# Patient Record
Sex: Male | Born: 1970 | Marital: Single | Smoking: Never smoker
Health system: Southern US, Community
[De-identification: ages and names within clinical notes are randomized; demographics above are authoritative.]

## PROBLEM LIST (undated history)

## (undated) DIAGNOSIS — E119 Type 2 diabetes mellitus without complications: Secondary | ICD-10-CM

## (undated) DIAGNOSIS — Z789 Other specified health status: Secondary | ICD-10-CM

## (undated) HISTORY — PX: NO PAST SURGERIES: SHX2092

---

## 2011-02-20 ENCOUNTER — Ambulatory Visit: Payer: Self-pay | Admitting: Family Medicine

## 2012-05-29 ENCOUNTER — Ambulatory Visit: Payer: Self-pay | Admitting: Family Medicine

## 2012-05-29 LAB — URINALYSIS, COMPLETE
Bilirubin,UR: NEGATIVE
Glucose,UR: NEGATIVE mg/dL (ref 0–75)
Ketone: NEGATIVE
Leukocyte Esterase: NEGATIVE
Ph: 6 (ref 4.5–8.0)

## 2012-06-30 ENCOUNTER — Ambulatory Visit: Payer: Self-pay | Admitting: Physician Assistant

## 2013-12-24 ENCOUNTER — Ambulatory Visit: Payer: Self-pay | Admitting: Family Medicine

## 2014-01-25 IMAGING — CT CT STONE STUDY
1 of 2 series · 15 of 32 positions shown, 19 images · non-contrast
Comparison: None

REASON FOR EXAM: Call Report [id number]  Eval for Stone  flank pain left
to groin area hist k...
COMMENTS:

PROCEDURE:     TILELO - TILELO ABDOMEN/PELVIS WO ( STONE)  - May 29, 2012 [DATE]
RESULT:     Indication: Flank Pain
TECHNIQUE: Multiple axial images from the lung bases to the symphysis pubis
were obtained without oral and without intravenous contrast.

[Series 2: soft tissue · axial · 0.78mm/px · z∈[-915,-465]mm · 15 of 166 slices shown, 19 images]
[im 8/166  soft-tissue]
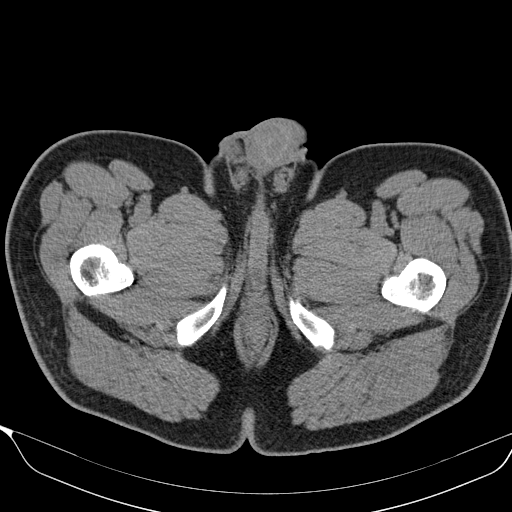
[im 8/166  bone]
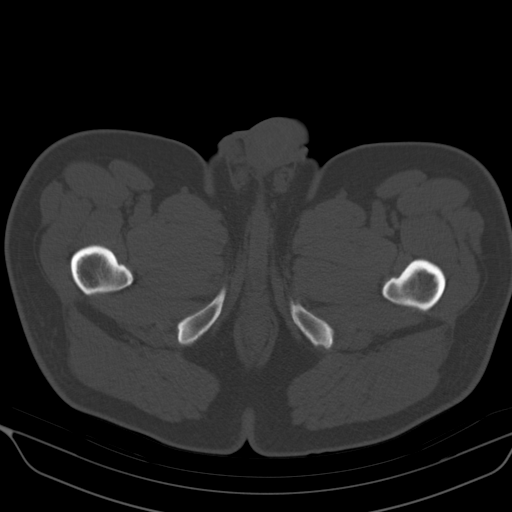
[im 22/166  soft-tissue]
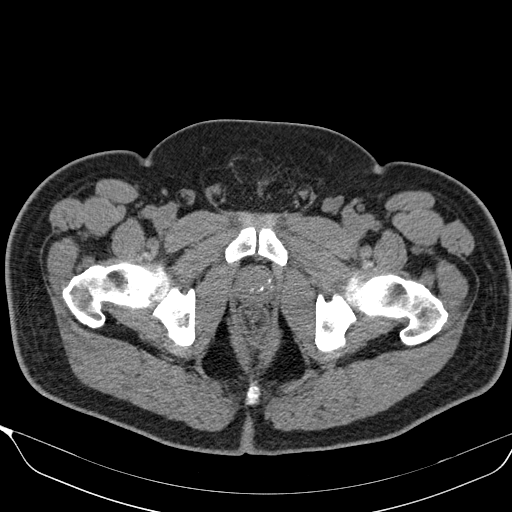
[im 36/166  soft-tissue]
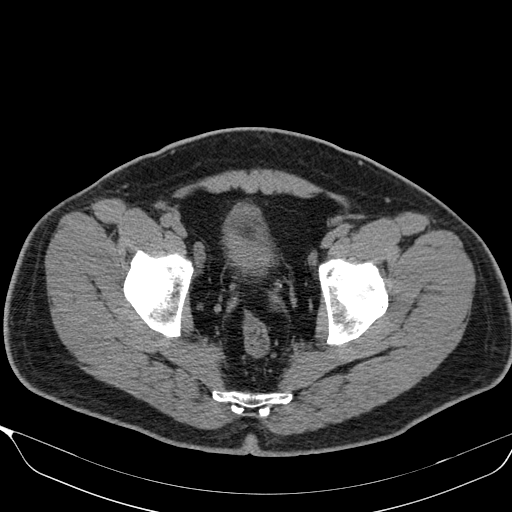
[im 44/166  soft-tissue]
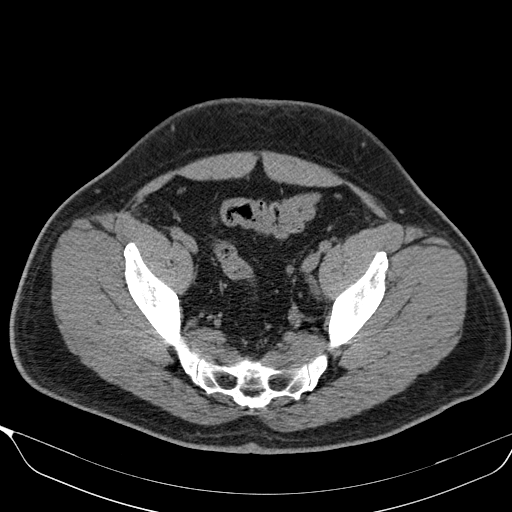
[im 58/166  soft-tissue]
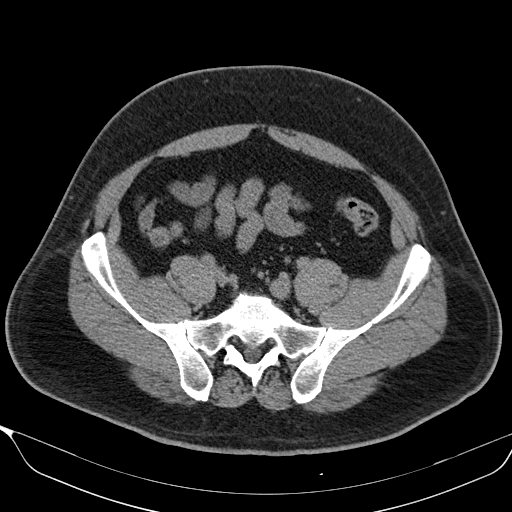
[im 72/166  soft-tissue]
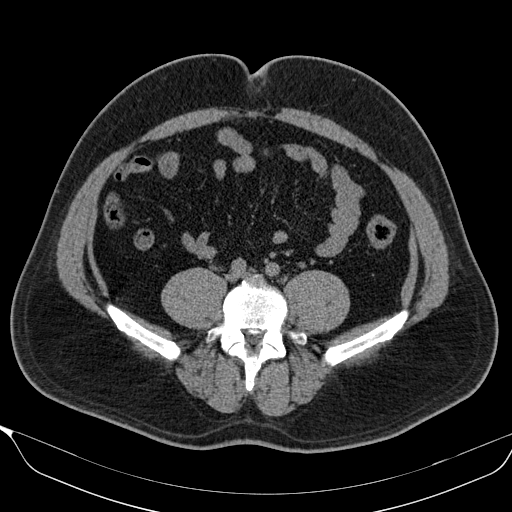
[im 87/166  soft-tissue]
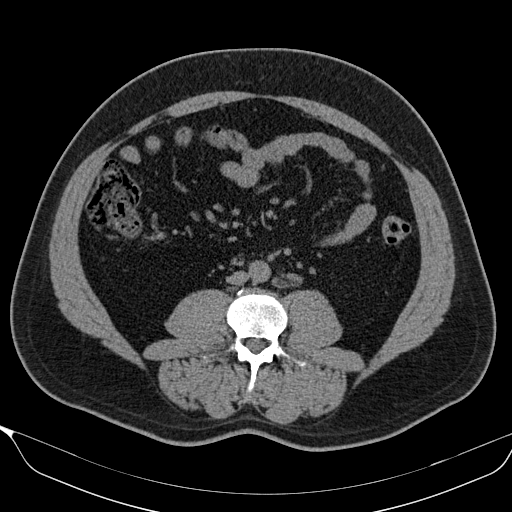
[im 94/166  soft-tissue]
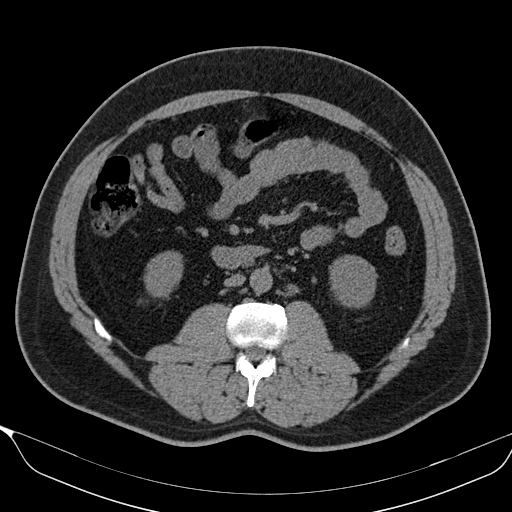
[im 108/166  soft-tissue]
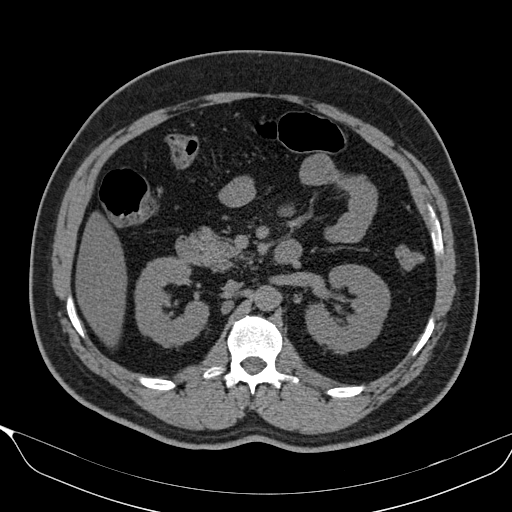
[im 108/166  bone]
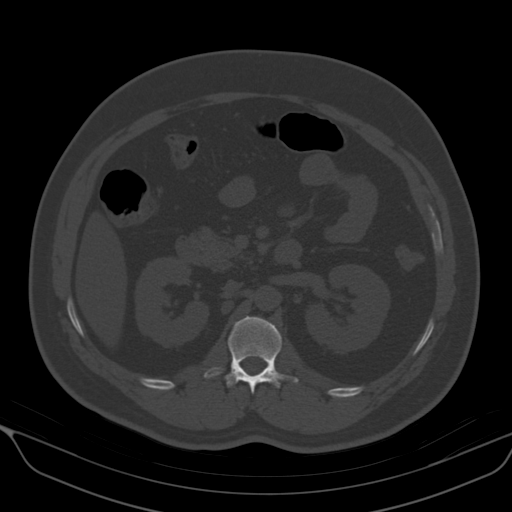
[im 122/166  soft-tissue]
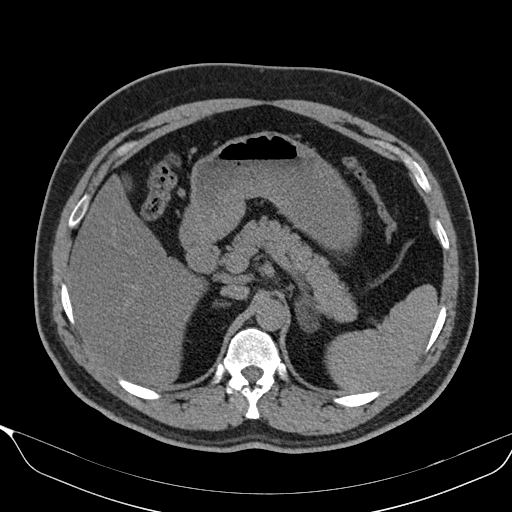
[im 130/166  soft-tissue]
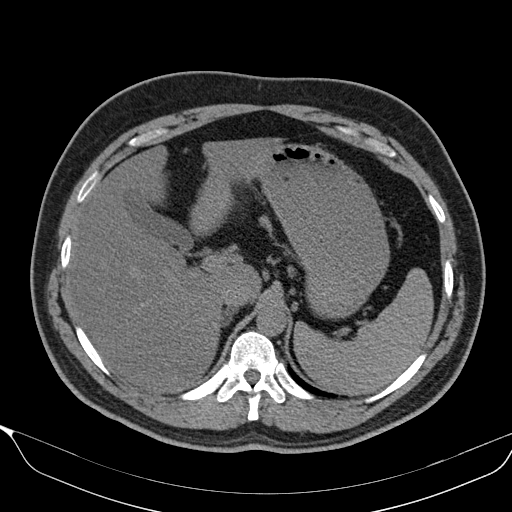
[im 137/166  lung]
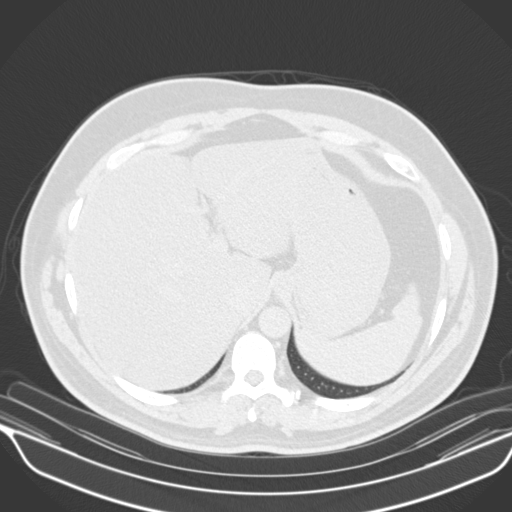
[im 144/166  soft-tissue]
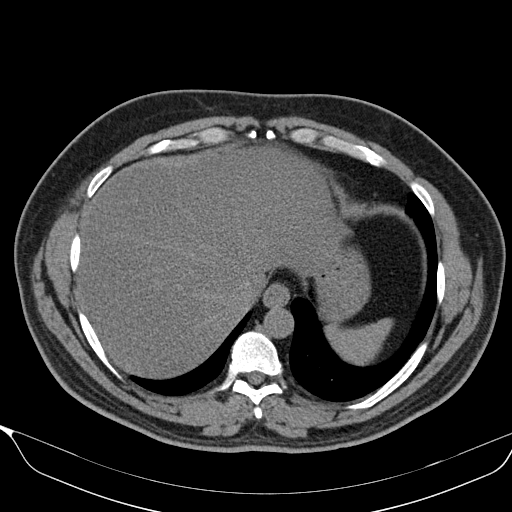
[im 144/166  lung]
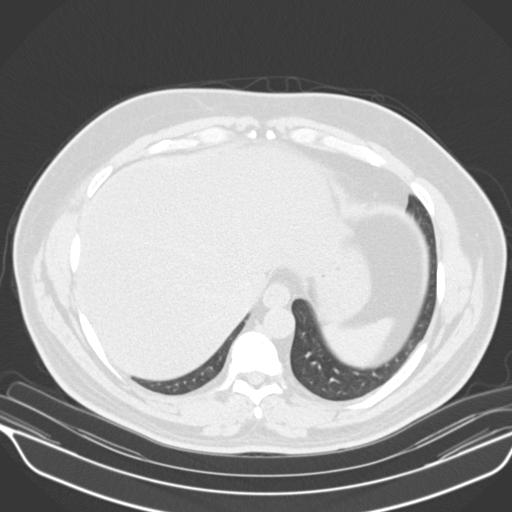
[im 151/166  lung]
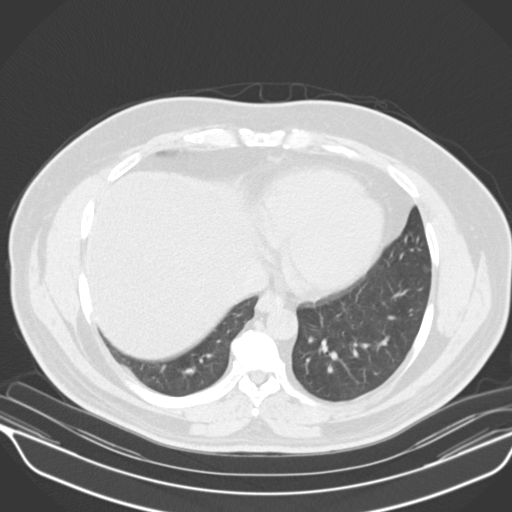
[im 158/166  soft-tissue]
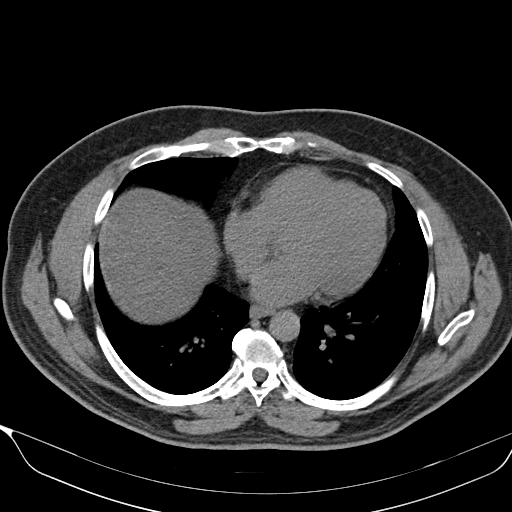
[im 158/166  lung]
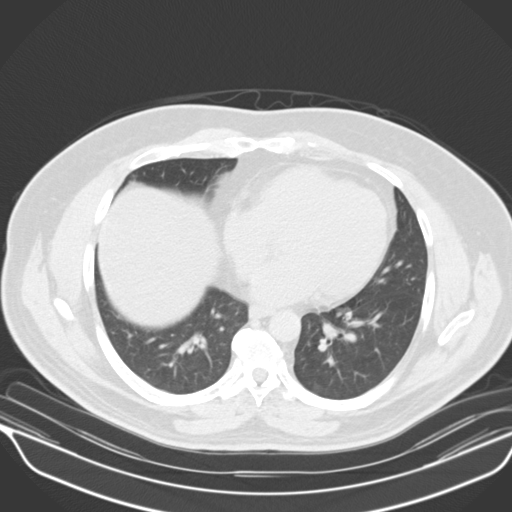

[15 of 32 positions shown; findings below may reference images not displayed]

FINDINGS: The lung bases are clear. There is no pleural or pericardial effusions.

There is a 2mm distal left ureteral calculus resulting in minimal
hydronephrosis. No perinephric stranding is seen. The kidneys are symmetric
in size without evidence for exophytic mass. The bladder is unremarkable.

The liver is diffusely low in attenuation as can be seen with hepatic
steatosis. The gallbladder is unremarkable. The spleen demonstrates no focal
abnormality. The adrenal glands and pancreas are normal.

The unopacified stomach, duodenum, small intestine, and large intestine are
unremarkable, but evaluation is limited by lack of oral contrast.  There is
no pneumoperitoneum, pneumatosis, or portal venous gas. There is no
abdominal or pelvic free fluid. There is no lymphadenopathy.

The abdominal aorta is normal in caliber .

The osseous structures are unremarkable.
IMPRESSION: 1. There is a 2mm distal left ureteral calculus resulting in minimal
hydronephrosis.

2. Hepatic steatosis.

[REDACTED]

## 2015-01-11 ENCOUNTER — Other Ambulatory Visit: Payer: Self-pay

## 2015-01-11 ENCOUNTER — Emergency Department
Admission: EM | Admit: 2015-01-11 | Discharge: 2015-01-11 | Discharge status: Against Medical Advice | Payer: BLUE CROSS/BLUE SHIELD | Attending: Emergency Medicine | Admitting: Emergency Medicine

## 2015-01-11 ENCOUNTER — Emergency Department: Payer: BLUE CROSS/BLUE SHIELD

## 2015-01-11 DIAGNOSIS — R000 Tachycardia, unspecified: Secondary | ICD-10-CM | POA: Diagnosis present

## 2015-01-11 DIAGNOSIS — R0602 Shortness of breath: Secondary | ICD-10-CM | POA: Insufficient documentation

## 2015-01-11 HISTORY — DX: Other specified health status: Z78.9

## 2015-01-11 LAB — CBC
HEMATOCRIT: 46.3 % (ref 40.0–52.0)
Hemoglobin: 16.3 g/dL (ref 13.0–18.0)
MCH: 33 pg (ref 26.0–34.0)
MCHC: 35.1 g/dL (ref 32.0–36.0)
MCV: 94 fL (ref 80.0–100.0)
PLATELETS: 221 10*3/uL (ref 150–440)
RBC: 4.92 MIL/uL (ref 4.40–5.90)
RDW: 12.6 % (ref 11.5–14.5)
WBC: 9.8 10*3/uL (ref 3.8–10.6)

## 2015-01-11 LAB — BASIC METABOLIC PANEL
Anion gap: 10 (ref 5–15)
BUN: 19 mg/dL (ref 6–20)
CALCIUM: 9.4 mg/dL (ref 8.9–10.3)
CO2: 22 mmol/L (ref 22–32)
Chloride: 105 mmol/L (ref 101–111)
Creatinine, Ser: 1.32 mg/dL — ABNORMAL HIGH (ref 0.61–1.24)
GFR calc Af Amer: >60 mL/min (ref >60)
GFR calc non Af Amer: >60 mL/min (ref >60)
GLUCOSE: 154 mg/dL — AB (ref 65–99)
Potassium: 4.4 mmol/L (ref 3.5–5.1)
Sodium: 137 mmol/L (ref 135–145)

## 2015-01-11 LAB — TROPONIN I: Troponin I: <0.03 ng/mL (ref <0.031)

## 2015-01-11 NOTE — ED Notes (Signed)
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

## 2015-08-22 IMAGING — US US GALLBLADDER-BILIARY (RUQ)
1 series · 14 of 25 positions shown · non-contrast
Comparison: None.

CLINICAL DATA: Elevated LFTs

EXAM:
US ABDOMEN LIMITED - RIGHT UPPER QUADRANT

[Series 1: us gallbladder-biliary (ruq) · 0.28mm/px · 14 of 47 slices shown]
[im 1/47]
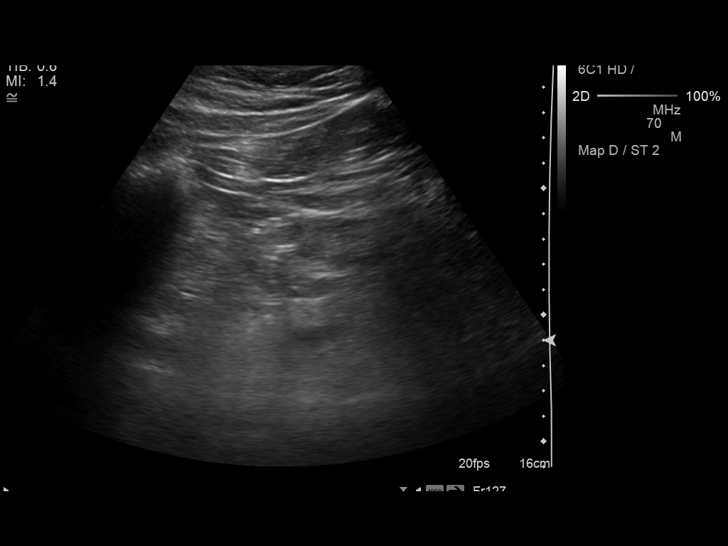
[im 4/47]
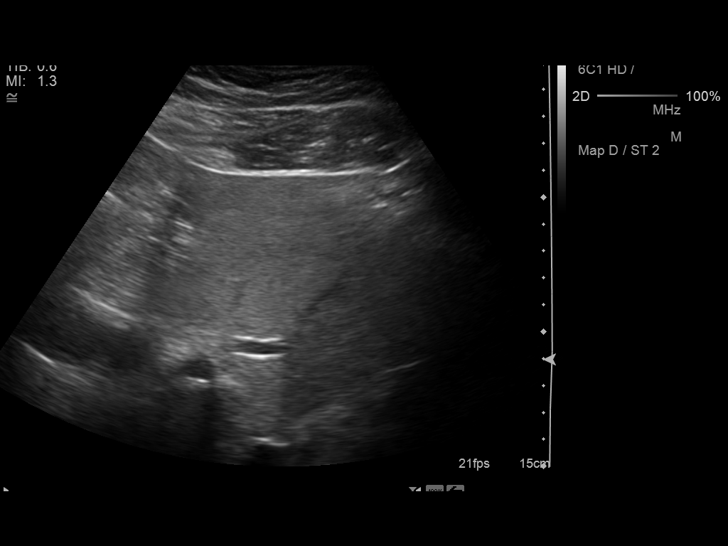
[im 8/47]
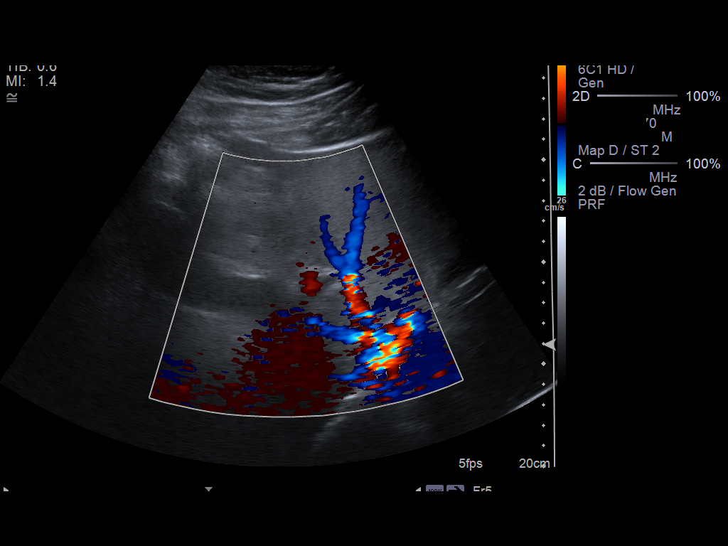
[im 12/47]
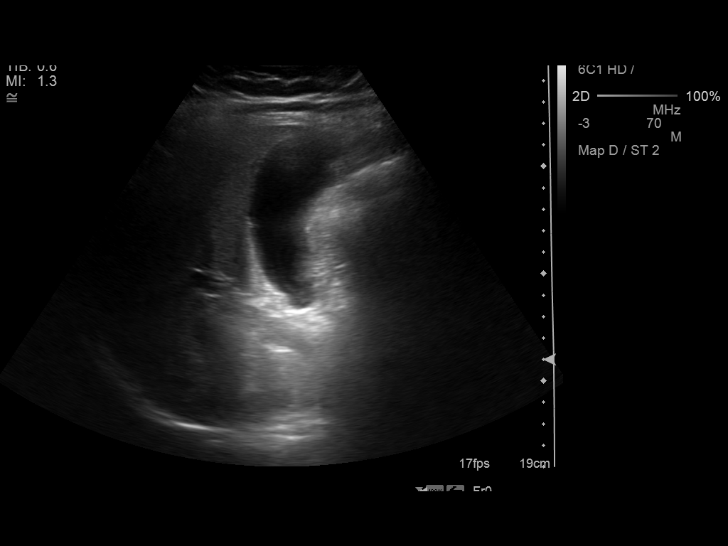
[im 16/47]
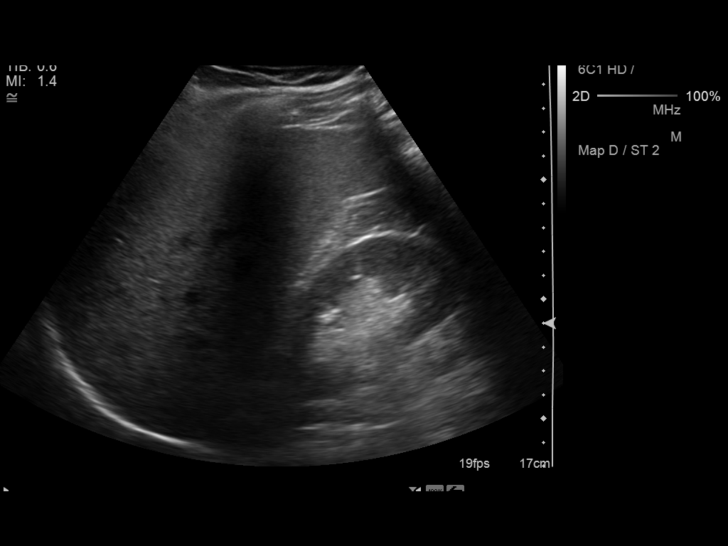
[im 18/47]
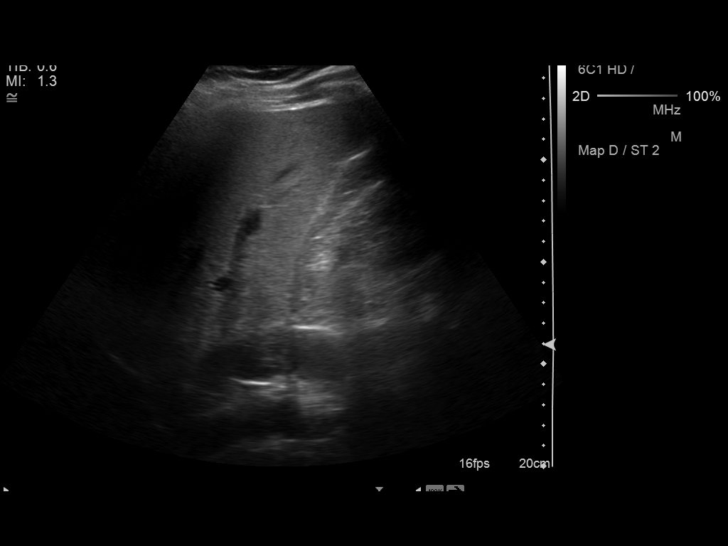
[im 22/47]
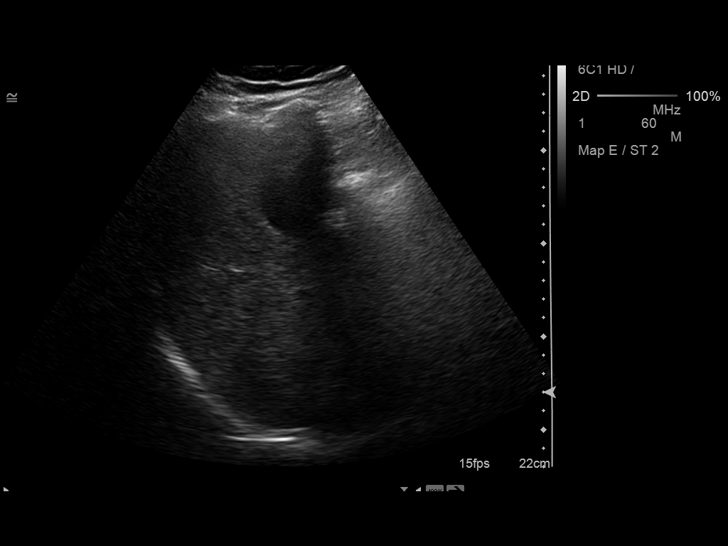
[im 25/47]
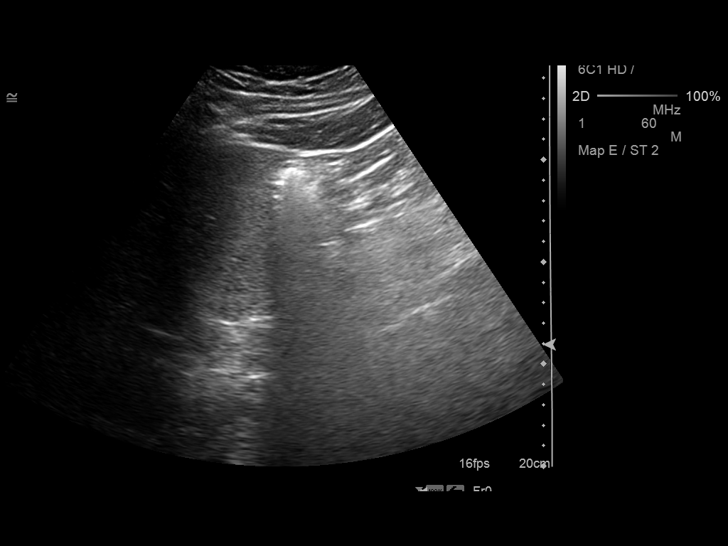
[im 29/47]
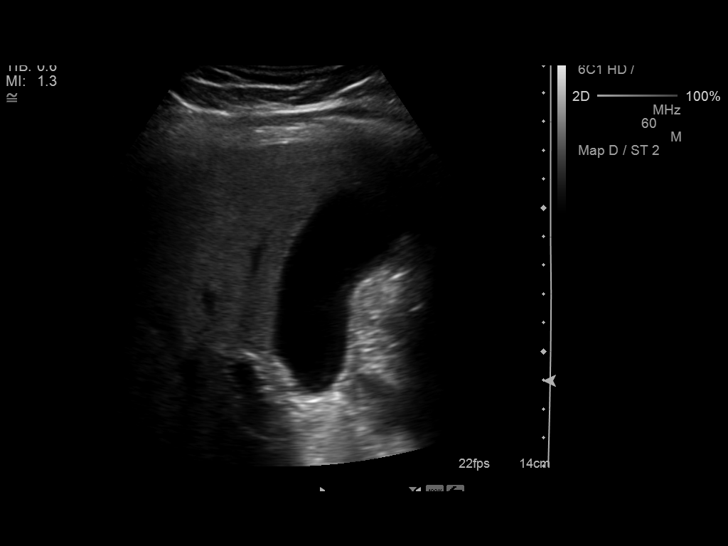
[im 31/47]
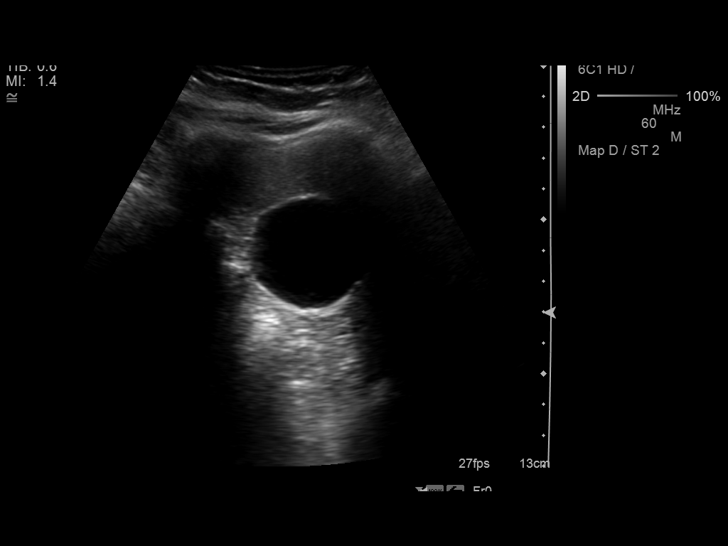
[im 35/47]
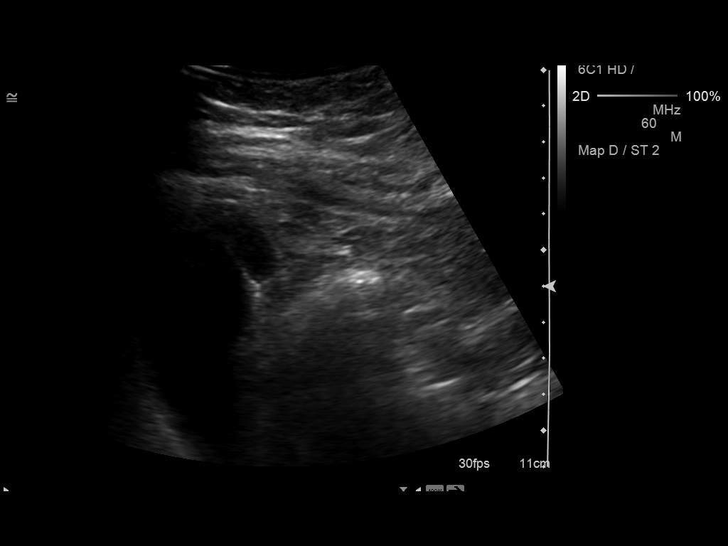
[im 39/47]
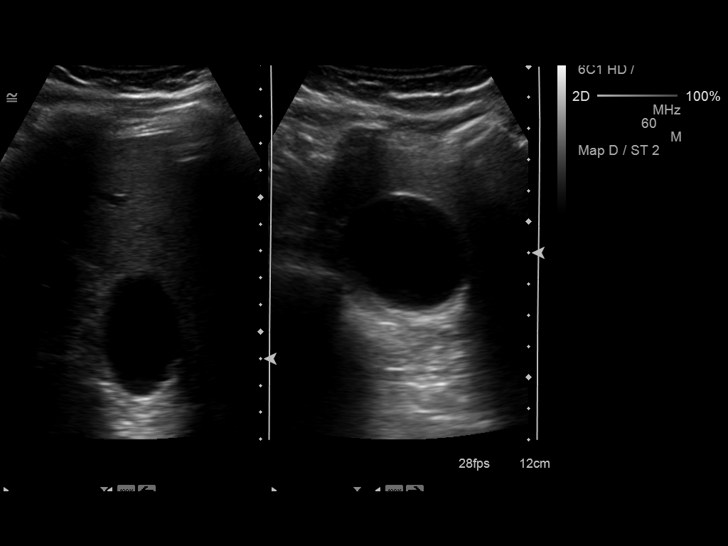
[im 43/47]
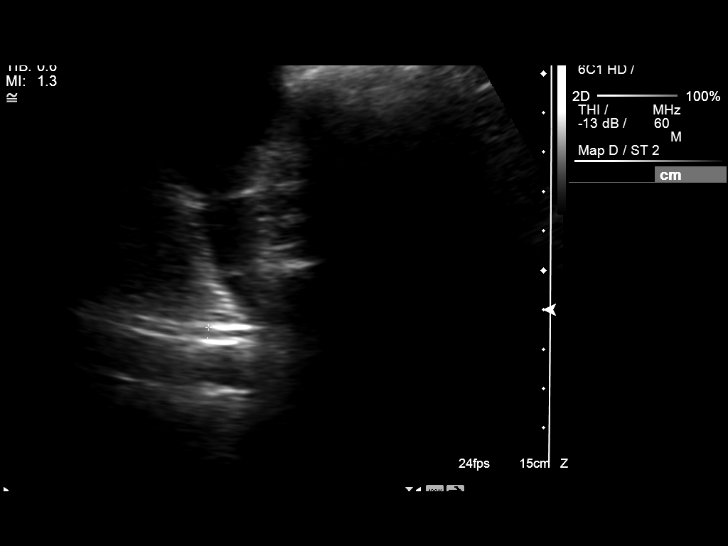
[im 47/47]
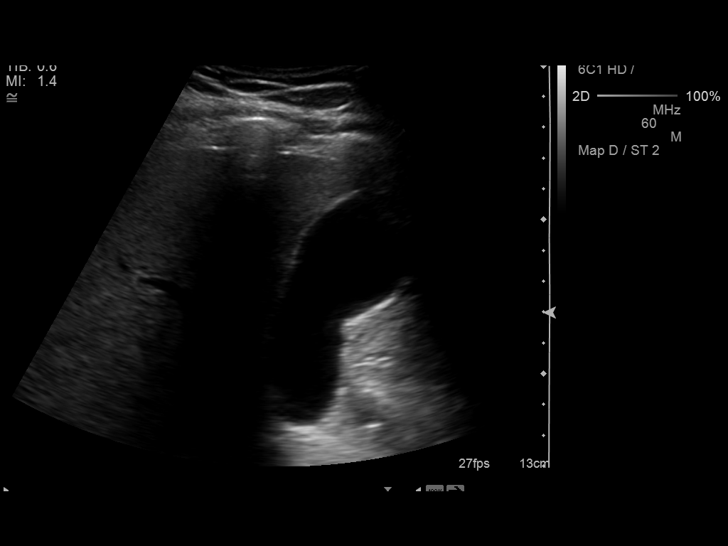

[14 of 25 positions shown; findings below may reference images not displayed]

FINDINGS: Gallbladder:

No gallstones or wall thickening visualized. No sonographic Murphy
sign noted.

Common bile duct:

Diameter: 3.3 mm

Liver:

No focal hepatic lesion. The liver is increased in echogenicity as
can be seen with hepatic steatosis.
IMPRESSION: 1. No cholelithiasis or sonographic evidence of acute cholecystitis.
2. Hepatic steatosis.

## 2016-09-08 IMAGING — CR DG CHEST 2V
1 series · 2 of 2 positions shown · non-contrast
Comparison: 02/20/2011

CLINICAL DATA: Tachycardia

EXAM:
CHEST  2 VIEW

[Series 1: dg chest 2 view · 0.14mm/px · 2 of 2 slices shown]
[im 1/2]
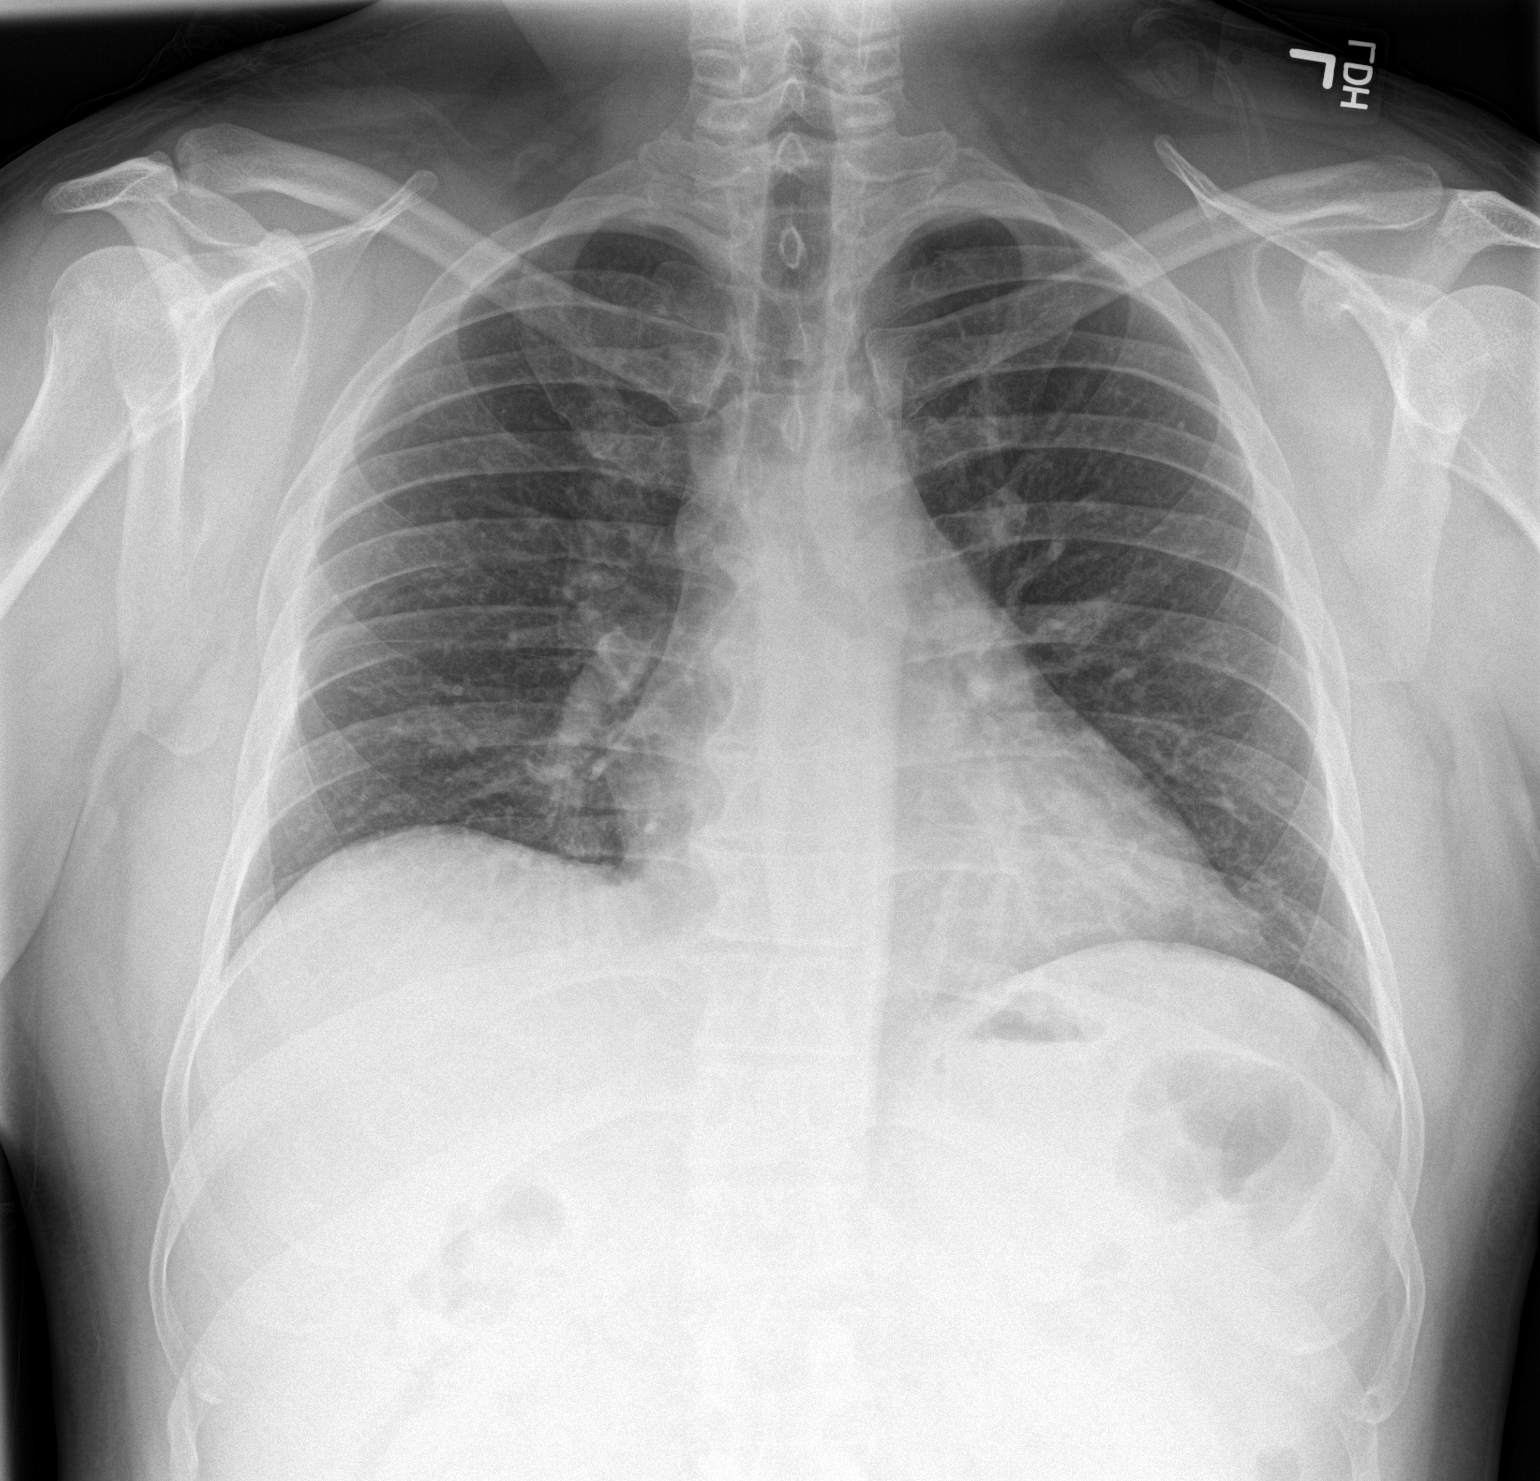
[im 2/2]
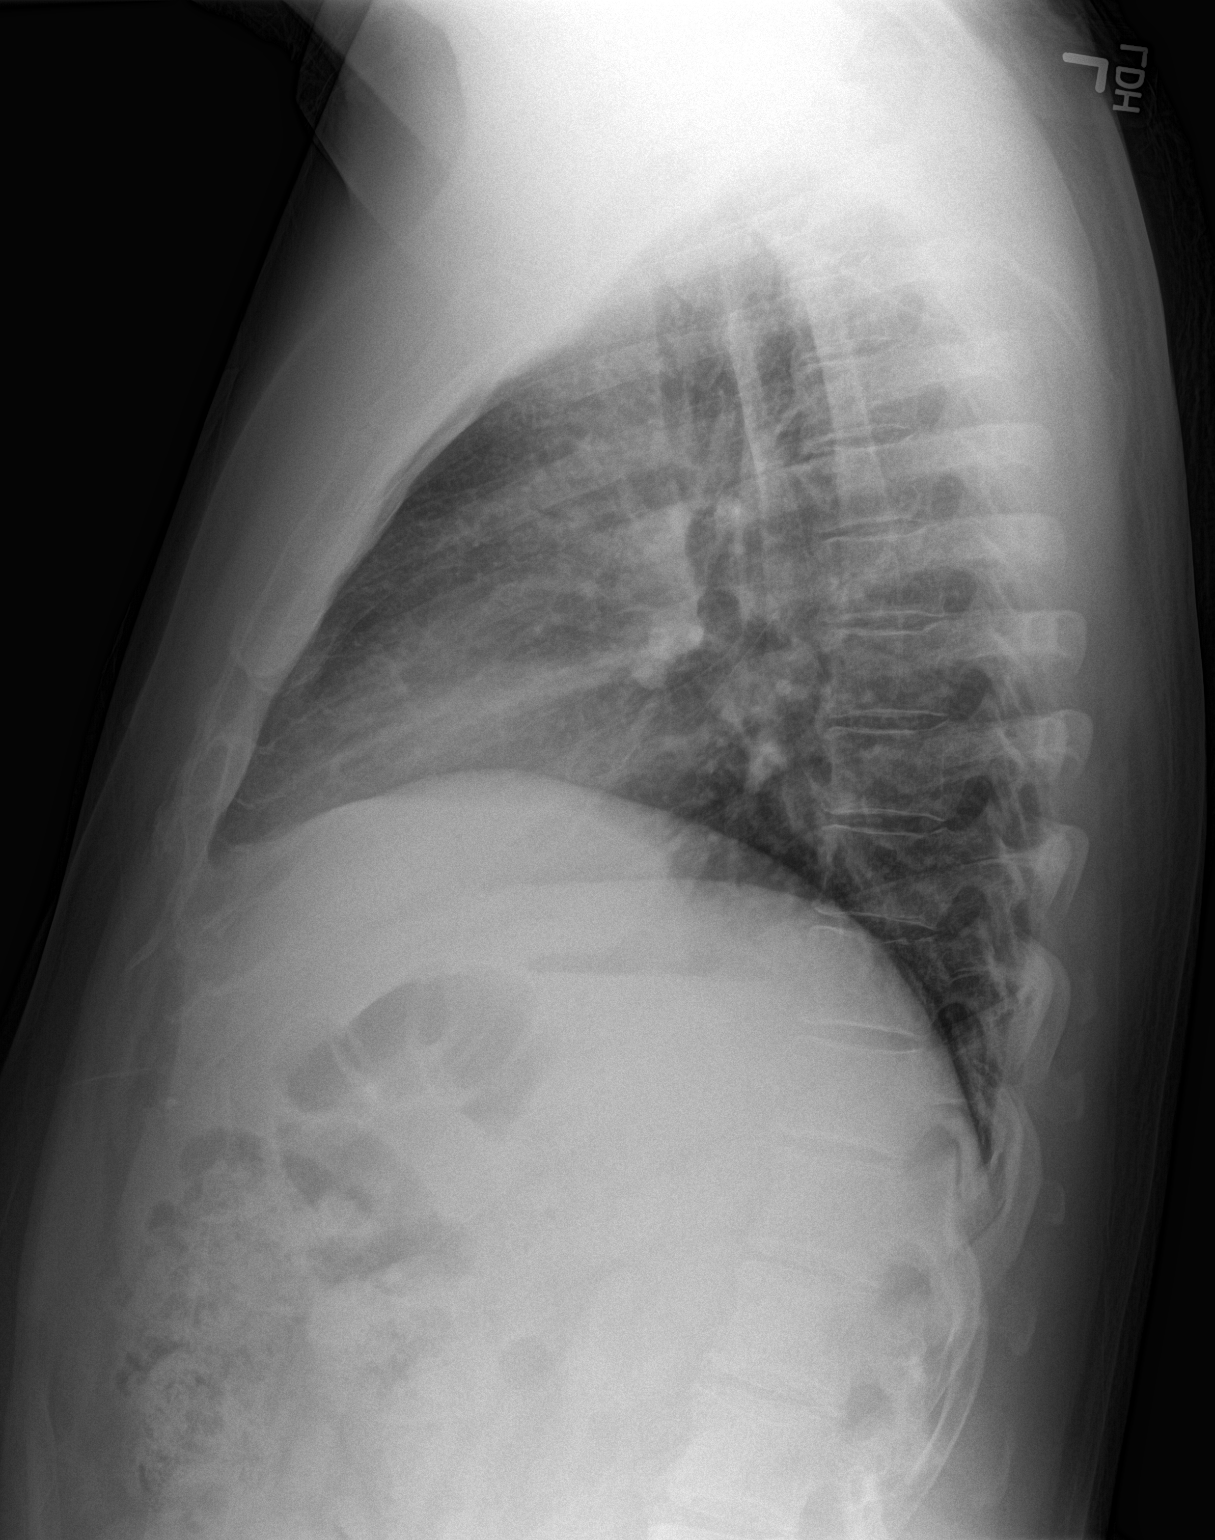

[2 of 2 positions shown; findings below may reference images not displayed]

FINDINGS: The heart size and mediastinal contours are within normal limits.
Both lungs are clear. The visualized skeletal structures are
unremarkable.
IMPRESSION: No active cardiopulmonary disease.

## 2018-10-16 ENCOUNTER — Ambulatory Visit
Admission: EM | Admit: 2018-10-16 | Discharge: 2018-10-16 | Disposition: A | Discharge status: Home / Self Care | Payer: BLUE CROSS/BLUE SHIELD | Attending: Family Medicine | Admitting: Family Medicine

## 2018-10-16 ENCOUNTER — Other Ambulatory Visit: Payer: Self-pay

## 2018-10-16 ENCOUNTER — Encounter: Payer: Self-pay | Admitting: Emergency Medicine

## 2018-10-16 DIAGNOSIS — F411 Generalized anxiety disorder: Secondary | ICD-10-CM

## 2018-10-16 DIAGNOSIS — R030 Elevated blood-pressure reading, without diagnosis of hypertension: Secondary | ICD-10-CM | POA: Diagnosis not present

## 2018-10-16 HISTORY — DX: Type 2 diabetes mellitus without complications: E11.9

## 2018-10-16 LAB — COMPREHENSIVE METABOLIC PANEL
ALT: 47 U/L — ABNORMAL HIGH (ref 0–44)
ANION GAP: 9 (ref 5–15)
AST: 39 U/L (ref 15–41)
Albumin: 4.6 g/dL (ref 3.5–5.0)
Alkaline Phosphatase: 75 U/L (ref 38–126)
BUN: 16 mg/dL (ref 6–20)
CO2: 25 mmol/L (ref 22–32)
Calcium: 9 mg/dL (ref 8.9–10.3)
Chloride: 103 mmol/L (ref 98–111)
Creatinine, Ser: 0.94 mg/dL (ref 0.61–1.24)
GFR calc non Af Amer: >60 mL/min (ref >60)
Glucose, Bld: 137 mg/dL — ABNORMAL HIGH (ref 70–99)
Potassium: 4 mmol/L (ref 3.5–5.1)
Sodium: 137 mmol/L (ref 135–145)
Total Bilirubin: 0.9 mg/dL (ref 0.3–1.2)
Total Protein: 8 g/dL (ref 6.5–8.1)

## 2018-10-16 LAB — CBC WITH DIFFERENTIAL/PLATELET
Abs Immature Granulocytes: 0.02 10*3/uL (ref 0.00–0.07)
BASOS PCT: 1 %
Basophils Absolute: 0.1 10*3/uL (ref 0.0–0.1)
EOS ABS: 0.2 10*3/uL (ref 0.0–0.5)
EOS PCT: 3 %
HCT: 44.6 % (ref 39.0–52.0)
Hemoglobin: 16.4 g/dL (ref 13.0–17.0)
Immature Granulocytes: 0 %
Lymphocytes Relative: 42 %
Lymphs Abs: 3.4 10*3/uL (ref 0.7–4.0)
MCH: 33.7 pg (ref 26.0–34.0)
MCHC: 36.8 g/dL — AB (ref 30.0–36.0)
MCV: 91.6 fL (ref 80.0–100.0)
MONO ABS: 0.5 10*3/uL (ref 0.1–1.0)
MONOS PCT: 6 %
Neutro Abs: 3.8 10*3/uL (ref 1.7–7.7)
Neutrophils Relative %: 48 %
Platelets: 247 10*3/uL (ref 150–400)
RBC: 4.87 MIL/uL (ref 4.22–5.81)
RDW: 11.4 % — ABNORMAL LOW (ref 11.5–15.5)
WBC: 8 10*3/uL (ref 4.0–10.5)
nRBC: 0 % (ref 0.0–0.2)

## 2018-10-16 LAB — TROPONIN I: Troponin I: <0.03 ng/mL (ref <0.03)

## 2018-10-16 NOTE — ED Provider Notes (Signed)
[facility]    CSN: [id number] Arrival date & time: 10/16/18  1020  History   Chief Complaint Chest tightness & SOB  HPI  48 year old male presents with the above complaints.  Patient reports he developed chest tightness and shortness of breath earlier this morning around 2 AM.  He was at work.  He states that he was walking when he developed his symptoms.  Patient states that he feels anxious.  Has difficulty catching his breath and taking a deep breath.  Feels that his chest is tight.  No diaphoresis.  He does report some nausea.  He has known type 2 diabetes which was improved with dietary/lifestyle changes.  Hyperlipidemia as well.  He has a family history of cardiac disease.  Patient believes that his symptoms are related to stress as he is under a great deal of stress at work.  He states that he is currently feeling improved.  No other associated symptoms.  No other complaints.  PMH, Surgical Hx, Family Hx, Social History reviewed and updated as below.  Past Medical History:  Diagnosis Date  . Diabetes mellitus without complication (HCC)   HLD Elevated LFT's GERD  Past Surgical History:  Procedure Laterality Date  . NO PAST SURGERIES      Home Medications    Prior to Admission medications   Medication Sig Start Date End Date Taking? Authorizing Provider  omeprazole (PRILOSEC) 20 MG capsule Take 20 mg by mouth daily.   Yes [provider]   Family History Family History  Problem Relation Age of Onset  . Heart disease Mother   . Mental illness Mother   . Diabetes Mother   . Suicidality Father     Social History Social History   Tobacco Use  . Smoking status: Never Smoker  . Smokeless tobacco: Never Used  Substance Use Topics  . Alcohol use: No  . Drug use: No   Allergies   Patient has no known allergies.   Review of Systems Review of Systems  Respiratory: Positive for chest tightness and shortness of breath.     Psychiatric/Behavioral: The patient is nervous/anxious.    Physical Exam Triage Vital Signs ED Triage Vitals  Enc Vitals Group     BP 10/16/18 1034 (!) 130/109     Pulse Rate 10/16/18 1034 75     Resp 10/16/18 1034 20     Temp 10/16/18 1034 97.8 F (36.6 C)     Temp Source 10/16/18 1034 Oral     SpO2 10/16/18 1034 97 %     Weight 10/16/18 1031 192 lb (87.1 kg)     Height 10/16/18 1031 5' 8.5" (1.74 m)     Head Circumference --      Peak Flow --      Pain Score 10/16/18 1030 2     Pain Loc --      Pain Edu? --      Excl. in GC? --    Updated Vital Signs BP (!) 130/109 (BP Location: Left Arm)   Pulse 75   Temp 97.8 F (36.6 C) (Oral)   Resp 20   Ht 5' 8.5" (1.74 m)   Wt 87.1 kg   SpO2 97%   BMI 28.77 kg/m   Visual Acuity Right Eye Distance:   Left Eye Distance:   Bilateral Distance:    Right Eye Near:   Left Eye Near:    Bilateral Near:     Physical Exam Vitals signs and nursing note reviewed.  Constitutional:      General: He is not in acute distress.    Appearance: Normal appearance.  HENT:     Head: Normocephalic and atraumatic.     Nose: Nose normal. No rhinorrhea.     Mouth/Throat:     Pharynx: Oropharynx is clear. No posterior oropharyngeal erythema.  Eyes:     General:        Right eye: No discharge.        Left eye: No discharge.     Conjunctiva/sclera: Conjunctivae normal.  Cardiovascular:     Rate and Rhythm: Normal rate and regular rhythm.  Pulmonary:     Effort: Pulmonary effort is normal.     Breath sounds: Normal breath sounds. No wheezing, rhonchi or rales.  Abdominal:     General: There is no distension.     Palpations: Abdomen is soft.     Tenderness: There is no abdominal tenderness.  Neurological:     Mental Status: He is alert.  Psychiatric:        Behavior: Behavior normal.     Comments: Anxious.    UC Treatments / Results  Labs (all labs ordered are listed, but only abnormal results are displayed) Labs Reviewed  CBC  WITH DIFFERENTIAL/PLATELET - Abnormal; Notable for the following components:      Result Value   MCHC 36.8 (*)    RDW 11.4 (*)    All other components within normal limits  COMPREHENSIVE METABOLIC PANEL - Abnormal; Notable for the following components:   Glucose, Bld 137 (*)    ALT 47 (*)    All other components within normal limits  TROPONIN I    EKG Interpretation: Normal sinus rhythm with rate of 63.  Normal axis.  Normal intervals.  No ST-T wave changes.  Normal EKG.  Radiology No results found.  Procedures Procedures (including critical care time)  Medications Ordered in UC Medications - No data to display  Initial Impression / Assessment and Plan / UC Course  I have reviewed the triage vital signs and the nursing notes.  Pertinent labs & imaging results that were available during my care of the patient were reviewed by me and considered in my medical decision making (see chart for details).    48 year old male presents with chest pain/tightness and associated shortness of breath.  EKG normal.  Laboratory studies including troponin unremarkable.  This is likely anxiety.  Advised rest and follow-up with PCP.  Final Clinical Impressions(s) / UC Diagnoses   Final diagnoses:  Anxiety state     Discharge Instructions     This appears to be anxiety.  Labs were good. Your glucose was slightly elevated.  Follow up with a PCP.  Take care  Dr. Adriana Simasook     ED Prescriptions    None     Controlled Substance Prescriptions [place] Controlled Substance Registry consulted? Not Applicable   Tommie SamsCook, Emilyanne Mcgough G, DO 10/16/18 1202

## 2018-10-16 NOTE — ED Triage Notes (Signed)
Pt c/o chest tightness. Started about 2 am this morning. He feels better than he did. Slight nausea, shortness of breath. Denies sweats. He has been under a lot of stress with work. SOB is worse when he is lying down.  Mother has heart disease but he does not the details. No personal history of heart problems. Pt is type 2 diabetic.

## 2018-10-16 NOTE — Discharge Instructions (Signed)
This appears to be anxiety.  Labs were good. Your glucose was slightly elevated.  Follow up with a PCP.  Take care  Dr. Adriana Simas

## 2019-02-17 ENCOUNTER — Other Ambulatory Visit: Payer: Self-pay

## 2019-02-17 ENCOUNTER — Ambulatory Visit
Admission: EM | Admit: 2019-02-17 | Discharge: 2019-02-17 | Disposition: A | Discharge status: Home / Self Care | Payer: BC Managed Care – PPO | Attending: Family Medicine | Admitting: Family Medicine

## 2019-02-17 DIAGNOSIS — H8111 Benign paroxysmal vertigo, right ear: Secondary | ICD-10-CM

## 2019-02-17 MED ORDER — MECLIZINE HCL 25 MG PO TABS: 25.0000 mg | ORAL_TABLET | Freq: Three times a day (TID) | ORAL | 0 refills | Status: AC | PRN

## 2019-02-17 NOTE — ED Triage Notes (Signed)
Patient complains of vertigo since Thursday. Patient states that he has had this before but never that has lasted for this amount of time. Patient states that dizziness is worse when he lays flat and moves head to the right. Patient states that symptoms worsen if he gets up quickly.

## 2019-02-17 NOTE — ED Provider Notes (Signed)
[facility]  CSN: [id number] Arrival date & time: 02/17/19  1209  History   Chief Complaint Chief Complaint  Patient presents with  . Dizziness   HPI   48 year old male presents with dizziness.  Patient reports that he has had ongoing dizziness since Friday.  Patient reports that he has had vertigo previously and this feels similar.  He states that he feels dizzy particularly when he lies down and with other certain movements.  Tickly with movements of his head to the right.  No medications or interventions tried other than lying still.  Described as room spinning.  No presyncope.  He does feel slightly unsteady on his feet.  No nausea or vomiting.  No other associated symptoms.  No other complaints.  PMH, Surgical Hx, Family Hx, Social History reviewed and updated as below.  PMH: DM-2, HLD, GERD, Elevated LFT's  Past Surgical History:  Procedure Laterality Date  . NO PAST SURGERIES     Home Medications    Prior to Admission medications   Medication Sig Start Date End Date Taking? Authorizing Provider  omeprazole (PRILOSEC) 20 MG capsule Take 20 mg by mouth daily.   Yes [provider]  meclizine (ANTIVERT) 25 MG tablet Take 1 tablet (25 mg total) by mouth 3 (three) times daily as needed for dizziness. 02/17/19   Tommie Samsook, Evaleen Sant G, DO    Family History Family History  Problem Relation Age of Onset  . Heart disease Mother   . Mental illness Mother   . Diabetes Mother   . Suicidality Father     Social History Social History   Tobacco Use  . Smoking status: Never Smoker  . Smokeless tobacco: Never Used  Substance Use Topics  . Alcohol use: No  . Drug use: No     Allergies   Patient has no known allergies.   Review of Systems Review of Systems  Constitutional: Negative.   Neurological: Positive for dizziness.   Physical Exam Triage Vital Signs ED Triage Vitals  Enc Vitals Group     BP 02/17/19 1243 (!) 147/94     Pulse Rate 02/17/19 1243  65     Resp 02/17/19 1243 16     Temp 02/17/19 1243 98.3 F (36.8 C)     Temp Source 02/17/19 1243 Oral     SpO2 02/17/19 1243 98 %     Weight 02/17/19 1242 196 lb (88.9 kg)     Height 02/17/19 1242 5\' 9"  (1.753 m)     Head Circumference --      Peak Flow --      Pain Score 02/17/19 1242 0     Pain Loc --      Pain Edu? --      Excl. in GC? --    Updated Vital Signs BP (!) 147/94 (BP Location: Right Arm)   Pulse 65   Temp 98.3 F (36.8 C) (Oral)   Resp 16   Ht 5\' 9"  (1.753 m)   Wt 88.9 kg   SpO2 98%   BMI 28.94 kg/m   Visual Acuity Right Eye Distance:   Left Eye Distance:   Bilateral Distance:    Right Eye Near:   Left Eye Near:    Bilateral Near:     Physical Exam Vitals signs and nursing note reviewed.  Constitutional:      General: He is not in acute distress.    Appearance: Normal appearance.  HENT:     Head: Normocephalic and atraumatic.  Eyes:     General:        Right eye: No discharge.        Left eye: No discharge.     Conjunctiva/sclera: Conjunctivae normal.     Pupils: Pupils are equal, round, and reactive to light.  Pulmonary:     Effort: Pulmonary effort is normal. No respiratory distress.  Neurological:     General: No focal deficit present.     Mental Status: He is alert and oriented to person, place, and time.  Psychiatric:        Mood and Affect: Mood normal.        Behavior: Behavior normal.    UC Treatments / Results  Labs (all labs ordered are listed, but only abnormal results are displayed) Labs Reviewed - No data to display  EKG None  Radiology No results found.  Procedures Procedures (including critical care time)  Medications Ordered in UC Medications - No data to display  Initial Impression / Assessment and Plan / UC Course  I have reviewed the triage vital signs and the nursing notes.  Pertinent labs & imaging results that were available during my care of the patient were reviewed by me and considered in my  medical decision making (see chart for details).    48 year old male presents with benign paroxysmal positional vertigo.  Advised to try the Epley maneuver at home.  Meclizine as needed.  Work note given.  Final Clinical Impressions(s) / UC Diagnoses   Final diagnoses:  Benign paroxysmal positional vertigo of right ear   Discharge Instructions   None    ED Prescriptions    Medication Sig Dispense Auth. Provider   meclizine (ANTIVERT) 25 MG tablet Take 1 tablet (25 mg total) by mouth 3 (three) times daily as needed for dizziness. 30 tablet Coral Spikes, DO     Controlled Substance Prescriptions [place] Controlled Substance Registry consulted? Not Applicable   Coral Spikes, DO 02/17/19 1348

## 2023-06-11 ENCOUNTER — Other Ambulatory Visit: Payer: Self-pay | Admitting: Family Medicine

## 2023-06-11 DIAGNOSIS — R310 Gross hematuria: Secondary | ICD-10-CM

## 2023-06-12 ENCOUNTER — Ambulatory Visit
Admission: RE | Admit: 2023-06-12 | Discharge: 2023-06-12 | Disposition: A | Discharge status: Home / Self Care | Payer: BC Managed Care – PPO | Source: Ambulatory Visit | Attending: Family Medicine | Admitting: Family Medicine

## 2023-06-12 DIAGNOSIS — R310 Gross hematuria: Secondary | ICD-10-CM | POA: Diagnosis present

## 2023-06-12 MED ORDER — SODIUM CHLORIDE 0.9 % IV SOLN: INTRAVENOUS | Status: AC

## 2023-06-12 MED ORDER — IOHEXOL 300 MG/ML  SOLN
125.0000 mL | Freq: Once | INTRAMUSCULAR | Status: AC | PRN
  Administered 2023-06-12: 125 mL via INTRAVENOUS

## 2023-09-04 ENCOUNTER — Other Ambulatory Visit: Payer: Self-pay

## 2023-09-04 ENCOUNTER — Ambulatory Visit: Payer: Self-pay | Admitting: Urology

## 2023-09-04 DIAGNOSIS — R310 Gross hematuria: Secondary | ICD-10-CM
